# Patient Record
Sex: Female | Born: 1955 | Race: Black or African American | Hispanic: No | Marital: Single | Smoking: Never smoker
Health system: Southern US, Community
[De-identification: ages and names within clinical notes are randomized; demographics above are authoritative.]

## PROBLEM LIST (undated history)

## (undated) DIAGNOSIS — E7800 Pure hypercholesterolemia, unspecified: Secondary | ICD-10-CM

## (undated) DIAGNOSIS — E079 Disorder of thyroid, unspecified: Secondary | ICD-10-CM

---

## 2000-12-02 ENCOUNTER — Emergency Department (HOSPITAL_COMMUNITY): Admission: EM | Admit: 2000-12-02 | Discharge: 2000-12-03 | Payer: Self-pay | Admitting: Emergency Medicine

## 2001-12-17 ENCOUNTER — Other Ambulatory Visit: Admission: RE | Admit: 2001-12-17 | Discharge: 2001-12-17 | Payer: Self-pay | Admitting: *Deleted

## 2003-02-02 ENCOUNTER — Emergency Department (HOSPITAL_COMMUNITY): Admission: AD | Admit: 2003-02-02 | Discharge: 2003-02-02 | Payer: Self-pay | Admitting: Family Medicine

## 2004-07-18 ENCOUNTER — Emergency Department (HOSPITAL_COMMUNITY): Admission: EM | Admit: 2004-07-18 | Discharge: 2004-07-18 | Payer: Self-pay | Admitting: Emergency Medicine

## 2004-08-06 ENCOUNTER — Ambulatory Visit (HOSPITAL_COMMUNITY): Admission: RE | Admit: 2004-08-06 | Discharge: 2004-08-06 | Payer: Self-pay | Admitting: Obstetrics

## 2004-08-14 ENCOUNTER — Encounter (HOSPITAL_COMMUNITY): Admission: RE | Admit: 2004-08-14 | Discharge: 2004-08-15 | Payer: Self-pay | Admitting: Family Medicine

## 2004-09-11 ENCOUNTER — Ambulatory Visit (HOSPITAL_COMMUNITY): Admission: RE | Admit: 2004-09-11 | Discharge: 2004-09-11 | Payer: Self-pay | Admitting: Family Medicine

## 2004-10-18 ENCOUNTER — Ambulatory Visit (HOSPITAL_COMMUNITY): Admission: RE | Admit: 2004-10-18 | Discharge: 2004-10-18 | Payer: Self-pay | Admitting: Endocrinology

## 2006-04-16 IMAGING — NM NM RAI THERAPY FOR HYPERTHYROIDISM
1 series · 1 of 1 positions shown · non-contrast
Comparison: none

HISTORY: Toxic nodular goiter

RADIOACTIVE IODINE THERAPY FOR HYPERTHYROIDISM:
Correlation made to preceding uptake and scan of 08/17/2004.
At the direction of the Dr. Matilda, 30 mCi of X-3N3 sodium iodide was
administered orally as therapy for toxic nodular goiter.

[st statics,dual det. · 2.48mm/px · 1 of 1 slices shown]
[im 1/1]
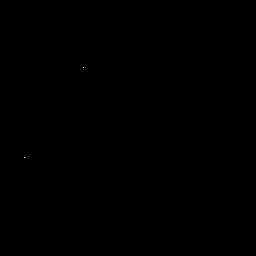

[1 of 1 positions shown; findings below may reference images not displayed]

IMPRESSION: Radioiodine therapy for toxic nodular goiter using 30 mCi of X-3N3.

## 2010-05-19 ENCOUNTER — Encounter: Payer: Self-pay | Admitting: Family Medicine

## 2012-06-29 ENCOUNTER — Encounter (HOSPITAL_COMMUNITY): Payer: Self-pay | Admitting: *Deleted

## 2012-06-29 ENCOUNTER — Emergency Department (HOSPITAL_COMMUNITY)
Admission: EM | Admit: 2012-06-29 | Discharge: 2012-06-29 | Disposition: A | Discharge status: Home / Self Care | Payer: BC Managed Care – PPO | Attending: Emergency Medicine | Admitting: Emergency Medicine

## 2012-06-29 DIAGNOSIS — E7800 Pure hypercholesterolemia, unspecified: Secondary | ICD-10-CM | POA: Insufficient documentation

## 2012-06-29 DIAGNOSIS — R42 Dizziness and giddiness: Secondary | ICD-10-CM | POA: Insufficient documentation

## 2012-06-29 DIAGNOSIS — E079 Disorder of thyroid, unspecified: Secondary | ICD-10-CM | POA: Insufficient documentation

## 2012-06-29 DIAGNOSIS — R5381 Other malaise: Secondary | ICD-10-CM | POA: Insufficient documentation

## 2012-06-29 DIAGNOSIS — Z79899 Other long term (current) drug therapy: Secondary | ICD-10-CM | POA: Insufficient documentation

## 2012-06-29 HISTORY — DX: Pure hypercholesterolemia, unspecified: E78.00

## 2012-06-29 HISTORY — DX: Disorder of thyroid, unspecified: E07.9

## 2012-06-29 LAB — BASIC METABOLIC PANEL
Calcium: 9.4 mg/dL (ref 8.4–10.5)
Chloride: 100 mEq/L (ref 96–112)
Creatinine, Ser: 0.99 mg/dL (ref 0.50–1.10)
GFR calc non Af Amer: 63 mL/min — ABNORMAL LOW (ref >90)
Potassium: 3.8 mEq/L (ref 3.5–5.1)

## 2012-06-29 LAB — CBC WITH DIFFERENTIAL/PLATELET
Eosinophils Relative: 1 % (ref 0–5)
HCT: 40.6 % (ref 36.0–46.0)
Hemoglobin: 13.9 g/dL (ref 12.0–15.0)
Lymphs Abs: 2 10*3/uL (ref 0.7–4.0)
MCV: 85.5 fL (ref 78.0–100.0)
Monocytes Absolute: 0.2 10*3/uL (ref 0.1–1.0)
Neutrophils Relative %: 62 % (ref 43–77)
Platelets: 231 10*3/uL (ref 150–400)
RDW: 12.9 % (ref 11.5–15.5)

## 2012-06-29 MED ORDER — LORAZEPAM 1 MG PO TABS
1.0000 mg | ORAL_TABLET | Freq: Once | ORAL | Status: AC
  Administered 2012-06-29: 1 mg via ORAL
  Filled 2012-06-29: qty 1

## 2012-06-29 MED ORDER — MECLIZINE HCL 25 MG PO TABS
25.0000 mg | ORAL_TABLET | Freq: Once | ORAL | Status: AC
  Administered 2012-06-29: 25 mg via ORAL
  Filled 2012-06-29: qty 1

## 2012-06-29 MED ORDER — MECLIZINE HCL 25 MG PO TABS: 25.0000 mg | ORAL_TABLET | Freq: Three times a day (TID) | ORAL | Status: AC | PRN

## 2012-06-29 MED ORDER — ASPIRIN EC 81 MG PO TBEC
81.0000 mg | DELAYED_RELEASE_TABLET | Freq: Once | ORAL | Status: AC
  Administered 2012-06-29: 81 mg via ORAL
  Filled 2012-06-29: qty 1

## 2012-06-29 NOTE — ED Provider Notes (Signed)
History     CSN: [id number]  Arrival date & time 06/29/12  1433   First MD Initiated Contact with Patient 06/29/12 1528      Chief Complaint  Patient presents with  . Dizziness    (Consider location/radiation/quality/duration/timing/severity/associated sxs/prior treatment) Patient is a 57 y.o. female presenting with weakness. The history is provided by the patient. No language interpreter was used.  Weakness This is a new problem. The current episode started today. Associated symptoms include weakness. Pertinent negatives include no abdominal pain, chest pain, congestion, fever, headaches, nausea, neck pain or vomiting. Associated symptoms comments: She states that since this morning she is experiencing room-spinning dizziness only when she turns onto her right side while recumbent. No headache, N, V, visual changes, weakness, numbness or tingling. Symptoms resolve when she lies still for a period of time. She has very mild similar dizziness when she sits up or stands that is brief in duration. No similar previous symptoms..    Past Medical History  Diagnosis Date  . Thyroid disease   . Hypercholesterolemia     History reviewed. No pertinent past surgical history.  No family history on file.  History  Substance Use Topics  . Smoking status: Never Smoker   . Smokeless tobacco: Not on file  . Alcohol Use: No    OB History   Grav Para Term Preterm Abortions TAB SAB Ect Mult Living                  Review of Systems  Constitutional: Negative for fever and unexpected weight change.  HENT: Negative for congestion, neck pain and sinus pressure.   Respiratory: Negative for shortness of breath.   Cardiovascular: Negative for chest pain.  Gastrointestinal: Negative for nausea, vomiting and abdominal pain.  Genitourinary: Negative for dysuria.  Neurological: Positive for dizziness and weakness. Negative for headaches.  Psychiatric/Behavioral: Negative for confusion.     Allergies  Review of patient's allergies indicates no known allergies.  Home Medications   Current Outpatient Rx  Name  Route  Sig  Dispense  Refill  . levothyroxine (SYNTHROID, LEVOTHROID) 100 MCG tablet   Oral   Take 100 mcg by mouth daily.         . niacin (NIASPAN) 500 MG CR tablet   Oral   Take 500 mg by mouth every evening.         . simvastatin (ZOCOR) 40 MG tablet   Oral   Take 40 mg by mouth every evening.           BP 152/80  Pulse 101  Temp(Src) 98.2 F (36.8 C) (Oral)  Resp 18  SpO2 100%  Physical Exam  Constitutional: She is oriented to person, place, and time. She appears well-developed and well-nourished.  HENT:  Head: Normocephalic.  Eyes: Pupils are equal, round, and reactive to light.  Neck: Normal range of motion. Neck supple.  Cardiovascular: Normal rate and regular rhythm.   Pulmonary/Chest: Effort normal and breath sounds normal.  Abdominal: Soft. Bowel sounds are normal. There is no tenderness. There is no rebound and no guarding.  Musculoskeletal: Normal range of motion.  Neurological: She is alert and oriented to person, place, and time. She has normal strength and normal reflexes. No sensory deficit. She displays a negative Romberg sign. Coordination normal.  Skin: Skin is warm and dry. No rash noted.  Psychiatric: She has a normal mood and affect.    ED Course  Procedures (including critical care time)  Labs Reviewed  BASIC METABOLIC PANEL - Abnormal; Notable for the following:    Glucose, Bld 163 (*)    GFR calc non Af Amer 63 (*)    GFR calc Af Amer 72 (*)    All other components within normal limits  CBC WITH DIFFERENTIAL   No results found.   No diagnosis found.  1. Vertigo   MDM  She is feeling much better with medications, minimal symptoms on re-evaluation. Ambulatory with a steady gait. Discussed returning with any advancing symptoms.        Arnoldo Hooker, PA-C 06/29/12 1658

## 2012-06-29 NOTE — ED Notes (Signed)
Pt states she has been having dizziness since 0600 and states that she turns to the side and dizziness states.  No sob, chest pain, or vision problems.  No headache.

## 2012-06-30 NOTE — ED Provider Notes (Signed)
Medical screening examination/treatment/procedure(s) were performed by non-physician practitioner and as supervising physician I was immediately available for consultation/collaboration.   Lyanne Co, MD 06/30/12 [phone]

## 2013-09-21 ENCOUNTER — Ambulatory Visit: Payer: Self-pay

## 2013-09-26 ENCOUNTER — Ambulatory Visit (INDEPENDENT_AMBULATORY_CARE_PROVIDER_SITE_OTHER): Payer: BC Managed Care – PPO

## 2013-09-26 VITALS — BP 121/80 | HR 61 | Resp 15 | Ht 59.0 in | Wt 140.0 lb

## 2013-09-26 DIAGNOSIS — M2040 Other hammer toe(s) (acquired), unspecified foot: Secondary | ICD-10-CM

## 2013-09-26 DIAGNOSIS — M2010 Hallux valgus (acquired), unspecified foot: Secondary | ICD-10-CM

## 2013-09-26 DIAGNOSIS — M79609 Pain in unspecified limb: Secondary | ICD-10-CM

## 2013-09-26 DIAGNOSIS — M21619 Bunion of unspecified foot: Secondary | ICD-10-CM

## 2013-09-26 DIAGNOSIS — Q66219 Congenital metatarsus primus varus, unspecified foot: Secondary | ICD-10-CM

## 2013-09-26 DIAGNOSIS — M779 Enthesopathy, unspecified: Secondary | ICD-10-CM

## 2013-09-26 NOTE — Patient Instructions (Signed)
Pre-Operative Instructions  Congratulations, you have decided to take an important step to improving your quality of life.  You can be assured that the doctors of [facility] will be with you every step of the way.  1. Plan to be at the surgery center/hospital at least 1 (one) hour prior to your scheduled time unless otherwise directed by the surgical center/hospital staff.  You must have a responsible adult accompany you, remain during the surgery and drive you home.  Make sure you have directions to the surgical center/hospital and know how to get there on time. 2. For hospital based surgery you will need to obtain a history and physical form from your family physician within 1 month prior to the date of surgery- we will give you a form for you primary physician.  3. We make every effort to accommodate the date you request for surgery.  There are however, times where surgery dates or times have to be moved.  We will contact you as soon as possible if a change in schedule is required.   4. No Aspirin/Ibuprofen for one week before surgery.  If you are on aspirin, any non-steroidal anti-inflammatory medications (Mobic, Aleve, Ibuprofen) you should stop taking it 7 days prior to your surgery.  You make take Tylenol  For pain prior to surgery.  5. Medications- If you are taking daily heart and blood pressure medications, seizure, reflux, allergy, asthma, anxiety, pain or diabetes medications, make sure the surgery center/hospital is aware before the day of surgery so they may notify you which medications to take or avoid the day of surgery. 6. No food or drink after midnight the night before surgery unless directed otherwise by surgical center/hospital staff. 7. No alcoholic beverages 24 hours prior to surgery.  No smoking 24 hours prior to or 24 hours after surgery. 8. Wear loose pants or shorts- loose enough to fit over bandages, boots, and casts. 9. No slip on shoes, sneakers are best. 10. Bring  your boot with you to the surgery center/hospital.  Also bring crutches or a walker if your physician has prescribed it for you.  If you do not have this equipment, it will be provided for you after surgery. 11. If you have not been contracted by the surgery center/hospital by the day before your surgery, call to confirm the date and time of your surgery. 12. Leave-time from work may vary depending on the type of surgery you have.  Appropriate arrangements should be made prior to surgery with your employer. 13. Prescriptions will be provided immediately following surgery by your doctor.  Have these filled as soon as possible after surgery and take the medication as directed. 14. Remove nail polish on the operative foot. 15. Wash the night before surgery.  The night before surgery wash the foot and leg well with the antibacterial soap provided and water paying special attention to beneath the toenails and in between the toes.  Rinse thoroughly with water and dry well with a towel.  Perform this wash unless told not to do so by your physician.  Enclosed: 1 Ice pack (please put in freezer the night before surgery)   1 Hibiclens skin cleaner   Pre-op Instructions  If you have any questions regarding the instructions, do not hesitate to call our office.  [place]: [address] [place], [place] [address] [phone]  [place]: [address] [place], [place] [address] [phone]  [place]: [address]  , [place] [address] [phone]  Dr. Hiroki Wint   Tuchman DPM, Dr. Norman Regal DPM Dr. Yvonnie Schinke DPM, Dr. M. Todd Hyatt DPM, Dr. Kathryn Egerton DPM 

## 2013-09-26 NOTE — Progress Notes (Signed)
   Subjective:    Patient ID: Vicki Walls, female    DOB: June 24, 1955, 58 y.o.   MRN: [id number]  HPI Comments: N bunions L B/L feet, left > right D 4 years O worsening, especially wearing steel-toed shoes C 1st MPJs are enlarged, and 1st toes are abducted, consecutive toes are contracted, and feet are painful. A enclosed shoes, especially steel-toed shoes, and hard flooring T none     Review of Systems  All other systems reviewed and are negative.      Objective:   Physical Exam 58 year old after making to help since this time well-developed well-nourished oriented x3 with complaint of painful bunions of both feet typically with enclosed shoes but she her steel toe work boots difficulty with ambulation left foot more severe than right ulcers contractures of toes with corns second toe left foot due to irritation from shoe wear and deformity of the digit which is semirigid in nature as well as a track of hallux on the left foot. 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 Left foot is painful symptomatic with activities in relation position surgery was to plantar surgery for September of this year with the next several months. Understands that surgery will record her nonweightbearing for at least is 6-8 week duration 10-12 weeks complete in air fracture boot and probably 12 weeks before she can return to work activities after surgery. She will likely nonweightbearing with crutches or a rollabout knee walker. Patient understands that at this time consent form for Lapidus bunionectomy with possible reviewed modification for correction of proximal articular set and tracked hallux also hammertoe repair with K wire fixation second toe both of left foot. The surgical correction to be done an outpatient basis under IV sedation and regional anesthetic or local block. Patient will be in air fracture boot for 10-12 week duration as instructed 6 weeks minimum nonweightbearing all questions asked medication are answered there no complications and surgery scheduled her convenience with probing followup she plans aligned to the contralateral right foot which is severe possibly next year some time. Next  Harriet Masson DPM

## 2014-01-12 ENCOUNTER — Telehealth: Payer: Self-pay

## 2014-01-12 NOTE — Telephone Encounter (Signed)
Patient would like to schedule a foot surgery on October 29 th. Thanks

## 2014-01-17 ENCOUNTER — Telehealth: Payer: Self-pay | Admitting: *Deleted

## 2014-01-17 NOTE — Telephone Encounter (Signed)
Patient came by the office to schedule surgery.  I couldn't find her consent form.  She was going to go home and see if she mistakenly took it home with her.  I called and informed the patient that I found her papers for the surgery.  We will go ahead and schedule it for 02/22/2014.  The surgical center will call you 1-2 days prior to surgery to let you know about your arrival time.  She stated okay thank you.

## 2014-02-22 DIAGNOSIS — M2022 Hallux rigidus, left foot: Secondary | ICD-10-CM

## 2014-02-22 DIAGNOSIS — M2012 Hallux valgus (acquired), left foot: Secondary | ICD-10-CM

## 2014-02-28 ENCOUNTER — Ambulatory Visit (INDEPENDENT_AMBULATORY_CARE_PROVIDER_SITE_OTHER): Payer: BC Managed Care – PPO

## 2014-02-28 VITALS — BP 123/69 | HR 71 | Resp 13

## 2014-02-28 DIAGNOSIS — M21612 Bunion of left foot: Secondary | ICD-10-CM

## 2014-02-28 DIAGNOSIS — M2012 Hallux valgus (acquired), left foot: Secondary | ICD-10-CM

## 2014-02-28 DIAGNOSIS — Z09 Encounter for follow-up examination after completed treatment for conditions other than malignant neoplasm: Secondary | ICD-10-CM

## 2014-02-28 DIAGNOSIS — Q662 Congenital metatarsus (primus) varus: Secondary | ICD-10-CM

## 2014-02-28 DIAGNOSIS — M2042 Other hammer toe(s) (acquired), left foot: Secondary | ICD-10-CM

## 2014-02-28 DIAGNOSIS — Q66219 Congenital metatarsus primus varus, unspecified foot: Secondary | ICD-10-CM

## 2014-02-28 NOTE — Progress Notes (Signed)
   Subjective:    Patient ID: Vicki Walls, female    DOB: 04-14-1956, 58 y.o.   MRN: [id number]  HPI Comments: DOS 02/22/2014 left lapidus procedure with austin bunionectomy and left 2nd hammer toe.  Pt states she is doing fine with only occasional pain, and is off-loading the left foot by using crutches.     Review of Systemsno new findings or systemic changes noted     Objective:   Physical Exam Neurovascular status is intact pedal pulses palpable mild edema and ecchymosis consistent with postop course patient is 6 days status post Lapidus bunionectomy left foot as well as hammertoe repair second digit left foot with intact fixation x-rays reveal adequate correction of digits intact fixation of the toe with K wire ends screw fixation first metatarsal. Good clinical and radiographic alignment noted was a flexion proximally 30-35 plantar flexion 10:15 degrees. No dehiscence no discharge drainage or assessment good postop progress       Assessment & Plan:  Assessment good postop right is fine Lapidus bunionectomy and hammertoe second left presto compressive dressing reapplied at this time maintain air fracture boot nonweightbearing as instructed reappointed one week plan for suture removal patient will be in a boot for at least 6-7 more weeks at least 5 more weeks nonweightbearing. Reappointed one week for suture removal next  Harriet Masson DPM

## 2014-02-28 NOTE — Patient Instructions (Signed)

## 2014-03-07 ENCOUNTER — Ambulatory Visit (INDEPENDENT_AMBULATORY_CARE_PROVIDER_SITE_OTHER): Payer: BC Managed Care – PPO

## 2014-03-07 DIAGNOSIS — Z09 Encounter for follow-up examination after completed treatment for conditions other than malignant neoplasm: Secondary | ICD-10-CM

## 2014-03-07 DIAGNOSIS — Q662 Congenital metatarsus (primus) varus: Secondary | ICD-10-CM

## 2014-03-07 DIAGNOSIS — Q66219 Congenital metatarsus primus varus, unspecified foot: Secondary | ICD-10-CM

## 2014-03-07 DIAGNOSIS — M21612 Bunion of left foot: Secondary | ICD-10-CM

## 2014-03-07 DIAGNOSIS — M2012 Hallux valgus (acquired), left foot: Secondary | ICD-10-CM

## 2014-03-07 NOTE — Progress Notes (Signed)
   Subjective:    Patient ID: Vicki Walls, female    DOB: July 12, 1955, 58 y.o.   MRN: [id number]  HPI  Pt presents for suture removal  Review of Systemsno new findings or systemic changes noted     Objective:   Physical Exam Neurovascular status is intact pedal pulses are palpable patient is noted 2 weeks status post last Lapidus bunionectomy left and hammertoe repair second left sutures removed and the second toe this time Coflex wrap applied to the second toe of the buddy fashion to the third toe. An anklet is dispensed to maintain compression for the great toe joint and forefoot. Maintain boot and anklet at all times during the day patient will maintain nonweightbearing with crutches for 4 more weeks as instructed reappointed 4 weeks for long-term follow-up x-rays taken at last visit reveal good alignment clinically good alignment no dehiscence no discharge drainage no signs of infection       Assessment & Plan:  Assessment good postop progress compressive dressing to be maintained maintain air fracture boot and nonweightbearing for 4 more weeks Coflex wrapping of toes is recommended may resume normal bathing and hygiene as instructed recontact [place] any difficulties or changes in the interim. Next  Harriet Masson DPM

## 2014-03-07 NOTE — Patient Instructions (Addendum)
ICE INSTRUCTIONS  Apply ice or cold pack to the affected area at least 3 times a day for 10-15 minutes each time.  You should also use ice after prolonged activity or vigorous exercise.  Do not apply ice longer than 20 minutes at one time.  Always keep a cloth between your skin and the ice pack to prevent burns.  Being consistent and following these instructions will help control your symptoms.  We suggest you purchase a gel ice pack because they are reusable and do bit leak.  Some of them are designed to wrap around the area.  Use the method that works best for you.  Here are some other suggestions for icing.   Use a frozen bag of peas or corn-inexpensive and molds well to your body, usually stays frozen for 10 to 20 minutes.  Wet a towel with cold water and squeeze out the excess until it's damp.  Place in a bag in the freezer for 20 minutes. Then remove and use.  May resume normal bath or shower quick wash of the foot 5 or 10 minutes then apply Neosporin or cocoa butter to the incisions. Rewrap the toe with Coflex wrapping as instructed also maintain Ace compression stockinette during the day all times the stocking can be washed in the evening allowed to air dry and then reapplied to the foot every day. Next  Maintain boot and nonweightbearing for 4 more weeks as instructed must use crutches at all times and leave the boot on at all times including during sleep

## 2014-04-04 ENCOUNTER — Ambulatory Visit (INDEPENDENT_AMBULATORY_CARE_PROVIDER_SITE_OTHER): Payer: BC Managed Care – PPO

## 2014-04-04 VITALS — BP 134/69 | HR 86 | Resp 12

## 2014-04-04 DIAGNOSIS — M2012 Hallux valgus (acquired), left foot: Secondary | ICD-10-CM

## 2014-04-04 DIAGNOSIS — Z09 Encounter for follow-up examination after completed treatment for conditions other than malignant neoplasm: Secondary | ICD-10-CM

## 2014-04-04 DIAGNOSIS — M2042 Other hammer toe(s) (acquired), left foot: Secondary | ICD-10-CM

## 2014-04-04 NOTE — Patient Instructions (Addendum)
ICE INSTRUCTIONS  Apply ice or cold pack to the affected area at least 3 times a day for 10-15 minutes each time.  You should also use ice after prolonged activity or vigorous exercise.  Do not apply ice longer than 20 minutes at one time.  Always keep a cloth between your skin and the ice pack to prevent burns.  Being consistent and following these instructions will help control your symptoms.  We suggest you purchase a gel ice pack because they are reusable and do bit leak.  Some of them are designed to wrap around the area.  Use the method that works best for you.  Here are some other suggestions for icing.   Use a frozen bag of peas or corn-inexpensive and molds well to your body, usually stays frozen for 10 to 20 minutes.  Wet a towel with cold water and squeeze out the excess until it's damp.  Place in a bag in the freezer for 20 minutes. Then remove and use.   May discontinue the use of crutches at this time however must maintain the boot for 4 more weeks from today's date reappointed 4 weeks for follow-up x-ray at next visit bring walking or athletic shoes. Anticipate that by January 9 should be able to return to work activities  Do toe exercises moving great toe up and down 100 times a day to help maintain flexibility of the great toe joint

## 2014-04-04 NOTE — Progress Notes (Signed)
   Subjective:    Patient ID: Vicki Walls, female    DOB: 1955-11-10, 58 y.o.   MRN: [id number]  HPI  DOS 02/22/2014 left lapidus procedure with austin bunionectomy and left 2nd hammer toe.''LT FOOT AND 2ND TOE DOING OK AND HEELING PRETTY WELL.'' Review of Systems No new findings or systemic changes noted    Objective:   Physical Exam Neurovascular status is intact pedal pulses are palpable epicritic and proprioceptive sensations intact left foot is doing well status post Lapidus bunionectomy and hammertoe second left. Patient still has sutures intact and removed from the second toe left foot at this time also x-rays reveal good consolidation of the osteotomies and bone and the pin for the second toe was also removed at this time Neosporin Coflex wrap are applied to the second toe. No dehiscence no discharge drainage no signs of infection x-rays reveal good alignment of the first metatarsal base intact screw fixation good correction from bunion good range of motion approximately 4050 dorsal flexion 510 plantar flexion at the first MTP is noted advised to continue with exercising May discontinue crutches at this time and maintain walking the air fracture boot for 4 more weeks.       Assessment & Plan:  Assessment good postop progress following Lapidus bunionectomy left foot maintain boot for 4 more weeks reappointed 4 weeks for follow-up x-ray after that may return to come for walking tennis shoes anticipate returning to work in January 9 of 2016. Contact us any changes or exacerbations occur in the interim are maintain boot as instructed for the next 4 weeks also stressed the importance of continued range of motion exercises moving the big toe up and down 100 toe pushups a day as recommended next  Harriet Masson DPM

## 2014-05-02 ENCOUNTER — Ambulatory Visit (INDEPENDENT_AMBULATORY_CARE_PROVIDER_SITE_OTHER): Payer: BLUE CROSS/BLUE SHIELD

## 2014-05-02 VITALS — BP 122/75 | HR 71 | Resp 18

## 2014-05-02 DIAGNOSIS — M2012 Hallux valgus (acquired), left foot: Secondary | ICD-10-CM

## 2014-05-02 DIAGNOSIS — M2042 Other hammer toe(s) (acquired), left foot: Secondary | ICD-10-CM

## 2014-05-02 DIAGNOSIS — Z09 Encounter for follow-up examination after completed treatment for conditions other than malignant neoplasm: Secondary | ICD-10-CM

## 2014-05-02 NOTE — Patient Instructions (Signed)
ICE INSTRUCTIONS  Apply ice or cold pack to the affected area at least 3 times a day for 10-15 minutes each time.  You should also use ice after prolonged activity or vigorous exercise.  Do not apply ice longer than 20 minutes at one time.  Always keep a cloth between your skin and the ice pack to prevent burns.  Being consistent and following these instructions will help control your symptoms.  We suggest you purchase a gel ice pack because they are reusable and do bit leak.  Some of them are designed to wrap around the area.  Use the method that works best for you.  Here are some other suggestions for icing.   Use a frozen bag of peas or corn-inexpensive and molds well to your body, usually stays frozen for 10 to 20 minutes.  Wet a towel with cold water and squeeze out the excess until it's damp.  Place in a bag in the freezer for 20 minutes. Then remove and use.   Maintain a lace up athletic or walking shoe at all times no barefoot no flimsy shoes or flip-flops

## 2014-05-02 NOTE — Progress Notes (Signed)
   Subjective:    Patient ID: Vicki Walls, female    DOB: August 10, 1955, 59 y.o.   MRN: [id number]  HPI I AM DOING GOOD ON MY LEFT FOOT    Review of Systems no new findings or systemic changes noted    Objective:   Physical Exam Patient presents this time proxy 9-10 weeks status post Lapidus bunionectomy left foot with hammertoe repair second left incisions well coapted no pain discomfort mild edema noted still some contractures lesser digits noted the hallux appears to be a good rectus position intact screw fixation noted first metatarsal base good consolidation of the osteotomy noted good range of motion actively proxy 56 dorsiflexion 5-10 plantar flexion at the MTP joint. Assessment good postop progress       Assessment & Plan:  The postop progress fungal Lapidus bunionectomy left and hammertoe second left may discontinue boot and return to come for walking tennis or athletic shoe. Reappointed in one month for long-term follow-up and postop x-ray maintain appropriate accommodative shoes at all times no barefoot no flimsy shoes no flip-flops elevated ice when possible  Harriet Masson DPM

## 2014-06-06 ENCOUNTER — Ambulatory Visit (INDEPENDENT_AMBULATORY_CARE_PROVIDER_SITE_OTHER): Payer: BLUE CROSS/BLUE SHIELD

## 2014-06-06 VITALS — BP 114/60 | HR 80 | Resp 12

## 2014-06-06 DIAGNOSIS — Z09 Encounter for follow-up examination after completed treatment for conditions other than malignant neoplasm: Secondary | ICD-10-CM

## 2014-06-06 DIAGNOSIS — M2042 Other hammer toe(s) (acquired), left foot: Secondary | ICD-10-CM

## 2014-06-06 DIAGNOSIS — M21612 Bunion of left foot: Secondary | ICD-10-CM

## 2014-06-06 DIAGNOSIS — M2012 Hallux valgus (acquired), left foot: Secondary | ICD-10-CM

## 2014-06-06 NOTE — Progress Notes (Signed)
   Subjective:    Patient ID: Francee Nodal, female    DOB: 26-Sep-1955, 59 y.o.   MRN: [id number]  HPI  DOS 02/22/2014 left lapidus procedure with bunionectomy and left 2nd hammer toe.Marland Kitchen ''LT FOOT  STILL THROBS AT NIGHT.''  Review of Systems no new findings or systemic changes     Objective:   Physical Exam Neurovascular status is intact pedal pulses are palpable she is just over 3 months postop Lapidus bunionectomy left foot as well as hammertoe second left. The osteotomies are well healed and coapted x-rays and screws intact nondisplaced good [place] good consolidation of the first met cuneiform osteotomy noted no pain or discomfort on range of motion palpation has at least 60 dorsiflexion first MTP area 510 plantar flexion second toes and good rectus position and radiographically. Again pedal pulses palpable no excessive pain occasionally some throbbing or aching this is consistent with a long-term postop and mild arthropathy.       Assessment & Plan:  Assessment good postop progress good consolidation of osteotomies hallux appears rectus slight abduction noted although consistent with a normal architecture patient discharged to an as-needed basis for any future follow-up work without restrictions starting tomorrow next  [employer] DPM

## 2021-05-29 DIAGNOSIS — E785 Hyperlipidemia, unspecified: Secondary | ICD-10-CM | POA: Diagnosis not present

## 2021-05-29 DIAGNOSIS — E039 Hypothyroidism, unspecified: Secondary | ICD-10-CM | POA: Diagnosis not present

## 2021-05-29 DIAGNOSIS — E559 Vitamin D deficiency, unspecified: Secondary | ICD-10-CM | POA: Diagnosis not present

## 2021-05-29 DIAGNOSIS — R7303 Prediabetes: Secondary | ICD-10-CM | POA: Diagnosis not present

## 2021-05-29 DIAGNOSIS — N289 Disorder of kidney and ureter, unspecified: Secondary | ICD-10-CM | POA: Diagnosis not present

## 2021-06-06 DIAGNOSIS — R748 Abnormal levels of other serum enzymes: Secondary | ICD-10-CM | POA: Diagnosis not present

## 2021-06-06 DIAGNOSIS — E782 Mixed hyperlipidemia: Secondary | ICD-10-CM | POA: Diagnosis not present

## 2021-06-06 DIAGNOSIS — Z1211 Encounter for screening for malignant neoplasm of colon: Secondary | ICD-10-CM | POA: Diagnosis not present

## 2021-06-06 DIAGNOSIS — E039 Hypothyroidism, unspecified: Secondary | ICD-10-CM | POA: Diagnosis not present

## 2021-07-22 DIAGNOSIS — E559 Vitamin D deficiency, unspecified: Secondary | ICD-10-CM | POA: Diagnosis not present

## 2021-07-22 DIAGNOSIS — D170 Benign lipomatous neoplasm of skin and subcutaneous tissue of head, face and neck: Secondary | ICD-10-CM | POA: Diagnosis not present

## 2021-07-22 DIAGNOSIS — E039 Hypothyroidism, unspecified: Secondary | ICD-10-CM | POA: Diagnosis not present

## 2021-07-22 DIAGNOSIS — E785 Hyperlipidemia, unspecified: Secondary | ICD-10-CM | POA: Diagnosis not present

## 2021-07-22 DIAGNOSIS — R7303 Prediabetes: Secondary | ICD-10-CM | POA: Diagnosis not present

## 2021-07-22 DIAGNOSIS — N289 Disorder of kidney and ureter, unspecified: Secondary | ICD-10-CM | POA: Diagnosis not present

## 2021-07-31 DIAGNOSIS — D122 Benign neoplasm of ascending colon: Secondary | ICD-10-CM | POA: Diagnosis not present

## 2021-07-31 DIAGNOSIS — K635 Polyp of colon: Secondary | ICD-10-CM | POA: Diagnosis not present

## 2021-07-31 DIAGNOSIS — Z1211 Encounter for screening for malignant neoplasm of colon: Secondary | ICD-10-CM | POA: Diagnosis not present

## 2021-11-21 DIAGNOSIS — E559 Vitamin D deficiency, unspecified: Secondary | ICD-10-CM | POA: Diagnosis not present

## 2021-11-21 DIAGNOSIS — N289 Disorder of kidney and ureter, unspecified: Secondary | ICD-10-CM | POA: Diagnosis not present

## 2021-11-21 DIAGNOSIS — E785 Hyperlipidemia, unspecified: Secondary | ICD-10-CM | POA: Diagnosis not present

## 2021-11-21 DIAGNOSIS — E039 Hypothyroidism, unspecified: Secondary | ICD-10-CM | POA: Diagnosis not present

## 2021-11-21 DIAGNOSIS — Z0001 Encounter for general adult medical examination with abnormal findings: Secondary | ICD-10-CM | POA: Diagnosis not present

## 2021-11-21 DIAGNOSIS — R7303 Prediabetes: Secondary | ICD-10-CM | POA: Diagnosis not present

## 2021-12-18 DIAGNOSIS — D170 Benign lipomatous neoplasm of skin and subcutaneous tissue of head, face and neck: Secondary | ICD-10-CM | POA: Diagnosis not present

## 2021-12-23 DIAGNOSIS — D170 Benign lipomatous neoplasm of skin and subcutaneous tissue of head, face and neck: Secondary | ICD-10-CM | POA: Diagnosis not present

## 2022-01-31 ENCOUNTER — Telehealth: Payer: Self-pay | Admitting: Internal Medicine

## 2022-01-31 ENCOUNTER — Encounter: Payer: Self-pay | Admitting: Internal Medicine

## 2022-01-31 ENCOUNTER — Ambulatory Visit: Payer: Medicare Other | Admitting: Internal Medicine

## 2022-01-31 VITALS — BP 130/80 | HR 66 | Ht 59.0 in | Wt 136.0 lb

## 2022-01-31 DIAGNOSIS — E890 Postprocedural hypothyroidism: Secondary | ICD-10-CM | POA: Diagnosis not present

## 2022-01-31 LAB — TSH: TSH: 0.05 u[IU]/mL — ABNORMAL LOW (ref 0.35–5.50)

## 2022-01-31 MED ORDER — LEVOTHYROXINE SODIUM 88 MCG PO TABS: 88.0000 ug | ORAL_TABLET | Freq: Every day | ORAL | 3 refills | Status: DC

## 2022-01-31 NOTE — Telephone Encounter (Signed)
Let the patient know that the current dose of levothyroxine is too much for her and we will need to reduce the dose as below    Please confirm that she has been taking levothyroxine 100 mcg (see if she can check a bottle at home)   Stop levothyroxine 100 Start levothyroxine 88 mcg daily   Please schedule patient for a repeat lab appointment in 2 months   Thanks

## 2022-01-31 NOTE — Telephone Encounter (Signed)
Attempted to contact the patient regarding lab results and received no answer. LVM for a call back.

## 2022-01-31 NOTE — Progress Notes (Signed)
Name: Vicki Walls  MRN/ DOB: [id number], 10-08-1955    Age/ Sex: 66 y.o., female    PCP: Trey Sailors, PA   Reason for Endocrinology Evaluation: Hypothyroidism     Date of Initial Endocrinology Evaluation: 01/31/2022     HPI: Vicki Walls is a 66 y.o. female with a past medical history of postablative hypothyroid. The patient presented for initial endocrinology clinic visit on 01/31/2022 for consultative assistance with her Hypothyroidism.   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  She is s/p RAI on 10/18/2004   30 mCi of I-131.    She had a TSH of 82 u IU/mL in January 2023, but the patient admits that she had ran out of levothyroxine at the time    Weight stable  Denies local neck swelling  Denies loose stools or diarrhea  Denies palpitations    No biotin  Niece  with thyroid disease     HISTORY:  Past Medical History:  Past Medical History:  Diagnosis Date   Hypercholesterolemia    Thyroid disease    Past Surgical History: No past surgical history on file.  Social History:  reports that she has never smoked. She does not have any smokeless tobacco history on file. She reports that she does not drink alcohol and does not use drugs. Family History: family history includes Hypertension in her brother and mother.   HOME MEDICATIONS: Allergies as of 01/31/2022   No Known Allergies      Medication List        Accurate as of January 31, 2022 10:51 AM. If you have any questions, ask your nurse or doctor.          amoxicillin 500 MG capsule Commonly known as: AMOXIL   cephALEXin 500 MG capsule Commonly known as: KEFLEX   fenofibrate 145 MG tablet Commonly known as: TRICOR   ibuprofen 800 MG tablet Commonly known as: ADVIL   levothyroxine 100 MCG tablet Commonly known as:  SYNTHROID Take 100 mcg by mouth daily.   meclizine 25 MG tablet Commonly known as: ANTIVERT Take 1 tablet (25 mg total) by mouth 3 (three) times daily as needed.   niacin 500 MG ER tablet Commonly known as: VITAMIN B3 Take 500 mg by mouth every evening.   oxyCODONE-acetaminophen 5-325 MG tablet Commonly known as: PERCOCET/ROXICET   simvastatin 40 MG tablet Commonly known as: ZOCOR Take 40 mg by mouth every evening.          REVIEW OF SYSTEMS: A comprehensive ROS was conducted with the patient and is negative except as per HPI     OBJECTIVE:  VS: BP 130/80 (BP Location: Left Arm, Patient Position: Sitting, Cuff Size: Small)   Pulse 66   Ht '4\' 11"'$  (1.499 m)   Wt 136 lb (61.7 kg)   SpO2 99%   BMI 27.47 kg/m    Wt Readings from Last 3 Encounters:  01/31/22 136 lb (61.7 kg)  09/26/13 140 lb (63.5 kg)     EXAM: General: Pt appears well and is in NAD  Eyes: External eye exam normal without stare, lid lag or exophthalmos.  EOM intact.  PERRL.  Neck: General: Supple without adenopathy. Thyroid: Thyroid size normal.  No goiter or nodules appreciated. No thyroid bruit.  Lungs: Clear with good BS bilat with no rales, rhonchi, or wheezes  Heart:  Auscultation: RRR.  Abdomen: Normoactive bowel sounds, soft, nontender, without masses or organomegaly palpable  Extremities:  BL LE: No pretibial edema normal ROM and strength.  Mental Status: Judgment, insight: Intact Orientation: Oriented to time, place, and person Mood and affect: No depression, anxiety, or agitation     DATA REVIEWED:     Latest Reference Range & Units 01/31/22 11:02  TSH 0.35 - 5.50 uIU/mL 0.05 (L)    ASSESSMENT/PLAN/RECOMMENDATIONS:   Postablative hypothyroidism:  -Pt is clinically euthyroid  - Pt educated extensively on the correct way to take levothyroxine (first thing in the morning with water, 30 minutes before eating or taking other medications). - Pt encouraged to double dose the  following day if she were to miss a dose given long half-life of levothyroxine. -We discussed the importance of taking LT-for replacement as prescribed, we discussed the risk of myxedema coma with inappropriate replacement   Medications : Stop levothyroxine 100 mcg daily Start levothyroxine 88 mcg daily   Labs in 2 months  Signed electronically by: Mack Guise, MD  [facility]  [facility] [address] [place], [place] [address] Phone: [phone] FAX: [phone]   CC: Trey Sailors, [place] [address] Phone: [phone] Fax: [phone]   Return to Endocrinology clinic as below: No future appointments.

## 2022-02-03 NOTE — Telephone Encounter (Signed)
2 attempt to contact patient regarding labs. LVM for a call back.

## 2022-02-03 NOTE — Telephone Encounter (Signed)
LMTCB

## 2022-02-04 NOTE — Telephone Encounter (Signed)
LMTCB

## 2022-02-04 NOTE — Telephone Encounter (Signed)
Letter printed and sent for outgoing mail as we have been unable to successfully contact the patient after 3 attempts.

## 2022-03-06 DIAGNOSIS — Z1231 Encounter for screening mammogram for malignant neoplasm of breast: Secondary | ICD-10-CM | POA: Diagnosis not present

## 2022-05-22 DIAGNOSIS — E785 Hyperlipidemia, unspecified: Secondary | ICD-10-CM | POA: Diagnosis not present

## 2022-05-22 DIAGNOSIS — E559 Vitamin D deficiency, unspecified: Secondary | ICD-10-CM | POA: Diagnosis not present

## 2022-05-22 DIAGNOSIS — E039 Hypothyroidism, unspecified: Secondary | ICD-10-CM | POA: Diagnosis not present

## 2022-05-22 DIAGNOSIS — R7303 Prediabetes: Secondary | ICD-10-CM | POA: Diagnosis not present

## 2022-05-22 DIAGNOSIS — N289 Disorder of kidney and ureter, unspecified: Secondary | ICD-10-CM | POA: Diagnosis not present

## 2022-05-26 DIAGNOSIS — E559 Vitamin D deficiency, unspecified: Secondary | ICD-10-CM | POA: Diagnosis not present

## 2022-05-26 DIAGNOSIS — E785 Hyperlipidemia, unspecified: Secondary | ICD-10-CM | POA: Diagnosis not present

## 2022-05-26 DIAGNOSIS — N289 Disorder of kidney and ureter, unspecified: Secondary | ICD-10-CM | POA: Diagnosis not present

## 2022-05-26 DIAGNOSIS — E119 Type 2 diabetes mellitus without complications: Secondary | ICD-10-CM | POA: Diagnosis not present

## 2022-05-26 DIAGNOSIS — E039 Hypothyroidism, unspecified: Secondary | ICD-10-CM | POA: Diagnosis not present

## 2022-06-17 ENCOUNTER — Ambulatory Visit (INDEPENDENT_AMBULATORY_CARE_PROVIDER_SITE_OTHER): Payer: Medicare Other | Admitting: Podiatry

## 2022-06-17 ENCOUNTER — Other Ambulatory Visit: Payer: Self-pay | Admitting: Internal Medicine

## 2022-06-17 DIAGNOSIS — E559 Vitamin D deficiency, unspecified: Secondary | ICD-10-CM

## 2022-06-17 DIAGNOSIS — E039 Hypothyroidism, unspecified: Secondary | ICD-10-CM

## 2022-06-17 DIAGNOSIS — Z91199 Patient's noncompliance with other medical treatment and regimen due to unspecified reason: Secondary | ICD-10-CM

## 2022-06-17 NOTE — Progress Notes (Signed)
No show

## 2022-07-11 ENCOUNTER — Ambulatory Visit: Payer: Medicare Other | Admitting: Dietician

## 2022-07-28 DIAGNOSIS — E039 Hypothyroidism, unspecified: Secondary | ICD-10-CM | POA: Diagnosis not present

## 2022-07-28 DIAGNOSIS — E559 Vitamin D deficiency, unspecified: Secondary | ICD-10-CM | POA: Diagnosis not present

## 2022-07-28 DIAGNOSIS — E119 Type 2 diabetes mellitus without complications: Secondary | ICD-10-CM | POA: Diagnosis not present

## 2022-07-28 DIAGNOSIS — Z0000 Encounter for general adult medical examination without abnormal findings: Secondary | ICD-10-CM | POA: Diagnosis not present

## 2022-07-28 DIAGNOSIS — E785 Hyperlipidemia, unspecified: Secondary | ICD-10-CM | POA: Diagnosis not present

## 2022-08-05 ENCOUNTER — Ambulatory Visit (INDEPENDENT_AMBULATORY_CARE_PROVIDER_SITE_OTHER): Payer: Medicare Other | Admitting: Internal Medicine

## 2022-08-05 ENCOUNTER — Telehealth: Payer: Self-pay | Admitting: Internal Medicine

## 2022-08-05 ENCOUNTER — Encounter: Payer: Self-pay | Admitting: Internal Medicine

## 2022-08-05 VITALS — BP 124/72 | HR 74 | Ht 59.0 in | Wt 132.8 lb

## 2022-08-05 DIAGNOSIS — E890 Postprocedural hypothyroidism: Secondary | ICD-10-CM | POA: Diagnosis not present

## 2022-08-05 LAB — TSH: TSH: 0.03 u[IU]/mL — ABNORMAL LOW (ref 0.35–5.50)

## 2022-08-05 MED ORDER — LEVOTHYROXINE SODIUM 75 MCG PO TABS: 75.0000 ug | ORAL_TABLET | Freq: Every day | ORAL | 3 refills | Status: AC

## 2022-08-05 NOTE — Patient Instructions (Signed)

## 2022-08-05 NOTE — Telephone Encounter (Signed)
Patient advised and labs have been scheduled for 11/04/22

## 2022-08-05 NOTE — Progress Notes (Signed)
Name: Vicki Walls  MRN/ DOB: [id number], [birth year]    Age/ Sex: 67 y.o., female    PCP: Norm Salt, PA   Reason for Endocrinology Evaluation: Hypothyroidism     Date of Initial Endocrinology Evaluation: 01/31/2022    HPI: Ms. Vicki Walls is a 67 y.o. female with a past medical history of postablative hypothyroid. The patient presented for initial endocrinology clinic visit on 01/31/2022  for consultative assistance with her Hypothyroidism.   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  She is s/p RAI on 10/18/2004   30 mCi of I-131.    She had a TSH of 82 u IU/mL in January 2023, but the patient admits that she had ran out of levothyroxine at the time     No biotin  Niece  with thyroid disease   SUBJECTIVE:    Today (08/05/22): Vicki Walls is here for a follow up on hypothyroidism  Weight stable  Denies local neck swelling  Denies loose stools or diarrhea  Denies palpitations  Denies tremors    Levothyroxine 88 mcg daily   HISTORY:  Past Medical History:  Past Medical History:  Diagnosis Date   Hypercholesterolemia    Thyroid disease    Past Surgical History: No past surgical history on file.  Social History:  reports that she has never smoked. She does not have any smokeless tobacco history on file. She reports that she does not drink alcohol and does not use drugs. Family History: family history includes Hypertension in her brother and mother.   HOME MEDICATIONS: Allergies as of 08/05/2022   No Known Allergies      Medication List        Accurate as of August 05, 2022 10:26 AM. If you have any questions, ask your nurse or doctor.          STOP taking these medications    amoxicillin 500 MG capsule Commonly known as: AMOXIL Stopped by: Scarlette Shorts, MD   cephALEXin 500  MG capsule Commonly known as: KEFLEX Stopped by: Scarlette Shorts, MD   fenofibrate 145 MG tablet Commonly known as: TRICOR Stopped by: Scarlette Shorts, MD   oxyCODONE-acetaminophen 5-325 MG tablet Commonly known as: PERCOCET/ROXICET Stopped by: Scarlette Shorts, MD       TAKE these medications    ibuprofen 800 MG tablet Commonly known as: ADVIL   levothyroxine 88 MCG tablet Commonly known as: SYNTHROID Take 1 tablet (88 mcg total) by mouth daily.   meclizine 25 MG tablet Commonly known as: ANTIVERT Take 1 tablet (25 mg total) by mouth 3 (three) times daily as needed.   niacin 500 MG ER tablet Commonly known as: VITAMIN B3 Take 500 mg by mouth every evening.   simvastatin 40 MG tablet Commonly known as: ZOCOR Take 40 mg by mouth every evening.          REVIEW OF SYSTEMS: A comprehensive ROS was conducted with the patient and is negative except as per HPI     OBJECTIVE:  VS: BP 124/72 (BP Location: Left Arm, Patient Position: Sitting, Cuff Size: Small)   Pulse 74   Ht 4\' 11"  (1.499 m)   Wt 132 lb 12.8 oz (60.2 kg)   SpO2 98%   BMI 26.82 kg/m    Wt Readings from Last 3 Encounters:  08/05/22 132 lb 12.8  oz (60.2 kg)  01/31/22 136 lb (61.7 kg)  09/26/13 140 lb (63.5 kg)     EXAM: General: Pt appears well and is in NAD  Neck: General: Supple without adenopathy. Thyroid:No goiter or nodules appreciated.   Lungs: Clear with good BS bilat   Heart: Auscultation: RRR.  Abdomen: soft, nontender, without masses or organomegaly palpable  Extremities:  BL LE: No pretibial edema   Mental Status: Judgment, insight: Intact Orientation: Oriented to time, place, and person Mood and affect: No depression, anxiety, or agitation     DATA REVIEWED:   Latest Reference Range & Units 08/05/22 10:39  TSH 0.35 - 5.50 uIU/mL 0.03 (L)     ASSESSMENT/PLAN/RECOMMENDATIONS:   Postablative hypothyroidism:  -Pt is clinically euthyroid  - Pt educated  extensively on the correct way to take levothyroxine (first thing in the morning with water, 30 minutes before eating or taking other medications). - Pt encouraged to double dose the following day if she were to miss a dose given long half-life of levothyroxine. -TSH remains to be low, will reduce levothyroxine as below -Will repeat labs in 3 months    Medications :  Stop levothyroxine 88 mcg daily Start levothyroxine 75 mcg daily  Follow-up in 6 months  Signed electronically by: Lyndle Herrlich, MD  [facility] Endocrinology  [facility] [address] [place] [place], [place] [address] Phone: [phone] FAX: [phone]   CC: Norm Salt, [place] [address] [place] [address] Phone: [phone] Fax: [phone]   Return to Endocrinology clinic as below: No future appointments.

## 2022-08-05 NOTE — Telephone Encounter (Signed)
Please let the patient know that her thyroid test continues to show that the current dose of levothyroxine is too much for her..   Please stop levothyroxine 88 mcg daily and START levothyroxine 75 mcg daily   Please schedule the patient for repeat thyroid test in 3 months    Thanks

## 2022-10-16 DIAGNOSIS — E559 Vitamin D deficiency, unspecified: Secondary | ICD-10-CM | POA: Diagnosis not present

## 2022-10-16 DIAGNOSIS — E119 Type 2 diabetes mellitus without complications: Secondary | ICD-10-CM | POA: Diagnosis not present

## 2022-10-16 DIAGNOSIS — E785 Hyperlipidemia, unspecified: Secondary | ICD-10-CM | POA: Diagnosis not present

## 2022-10-16 DIAGNOSIS — E039 Hypothyroidism, unspecified: Secondary | ICD-10-CM | POA: Diagnosis not present

## 2022-10-27 DIAGNOSIS — E039 Hypothyroidism, unspecified: Secondary | ICD-10-CM | POA: Diagnosis not present

## 2022-10-27 DIAGNOSIS — E559 Vitamin D deficiency, unspecified: Secondary | ICD-10-CM | POA: Diagnosis not present

## 2022-10-27 DIAGNOSIS — E785 Hyperlipidemia, unspecified: Secondary | ICD-10-CM | POA: Diagnosis not present

## 2022-10-27 DIAGNOSIS — E119 Type 2 diabetes mellitus without complications: Secondary | ICD-10-CM | POA: Diagnosis not present

## 2022-11-04 ENCOUNTER — Other Ambulatory Visit (INDEPENDENT_AMBULATORY_CARE_PROVIDER_SITE_OTHER): Payer: Medicare Other

## 2022-11-04 DIAGNOSIS — E890 Postprocedural hypothyroidism: Secondary | ICD-10-CM | POA: Diagnosis not present

## 2022-11-04 LAB — T4, FREE: Free T4: 0.88 ng/dL (ref 0.60–1.60)

## 2022-11-04 LAB — TSH: TSH: 0.4 u[IU]/mL (ref 0.35–5.50)

## 2022-11-05 ENCOUNTER — Encounter: Payer: Self-pay | Admitting: Internal Medicine

## 2022-11-24 DIAGNOSIS — Z0001 Encounter for general adult medical examination with abnormal findings: Secondary | ICD-10-CM | POA: Diagnosis not present

## 2022-11-24 DIAGNOSIS — E119 Type 2 diabetes mellitus without complications: Secondary | ICD-10-CM | POA: Diagnosis not present

## 2022-11-24 DIAGNOSIS — E039 Hypothyroidism, unspecified: Secondary | ICD-10-CM | POA: Diagnosis not present

## 2022-11-24 DIAGNOSIS — E559 Vitamin D deficiency, unspecified: Secondary | ICD-10-CM | POA: Diagnosis not present

## 2022-11-24 DIAGNOSIS — E785 Hyperlipidemia, unspecified: Secondary | ICD-10-CM | POA: Diagnosis not present

## 2022-12-05 ENCOUNTER — Emergency Department (HOSPITAL_BASED_OUTPATIENT_CLINIC_OR_DEPARTMENT_OTHER): Payer: Medicare Other | Admitting: Radiology

## 2022-12-05 ENCOUNTER — Other Ambulatory Visit: Payer: Self-pay

## 2022-12-05 ENCOUNTER — Emergency Department (HOSPITAL_BASED_OUTPATIENT_CLINIC_OR_DEPARTMENT_OTHER)
Admission: EM | Admit: 2022-12-05 | Discharge: 2022-12-05 | Disposition: A | Discharge status: Home / Self Care | Payer: Medicare Other

## 2022-12-05 ENCOUNTER — Encounter (HOSPITAL_BASED_OUTPATIENT_CLINIC_OR_DEPARTMENT_OTHER): Payer: Self-pay | Admitting: Emergency Medicine

## 2022-12-05 DIAGNOSIS — M25712 Osteophyte, left shoulder: Secondary | ICD-10-CM | POA: Diagnosis not present

## 2022-12-05 DIAGNOSIS — Y92410 Unspecified street and highway as the place of occurrence of the external cause: Secondary | ICD-10-CM | POA: Diagnosis not present

## 2022-12-05 DIAGNOSIS — S199XXA Unspecified injury of neck, initial encounter: Secondary | ICD-10-CM | POA: Diagnosis not present

## 2022-12-05 DIAGNOSIS — M47812 Spondylosis without myelopathy or radiculopathy, cervical region: Secondary | ICD-10-CM | POA: Diagnosis not present

## 2022-12-05 DIAGNOSIS — M2578 Osteophyte, vertebrae: Secondary | ICD-10-CM | POA: Diagnosis not present

## 2022-12-05 DIAGNOSIS — M542 Cervicalgia: Secondary | ICD-10-CM | POA: Diagnosis not present

## 2022-12-05 DIAGNOSIS — M4310 Spondylolisthesis, site unspecified: Secondary | ICD-10-CM | POA: Diagnosis not present

## 2022-12-05 DIAGNOSIS — M79602 Pain in left arm: Secondary | ICD-10-CM | POA: Insufficient documentation

## 2022-12-05 DIAGNOSIS — S299XXA Unspecified injury of thorax, initial encounter: Secondary | ICD-10-CM | POA: Diagnosis not present

## 2022-12-05 DIAGNOSIS — S4992XA Unspecified injury of left shoulder and upper arm, initial encounter: Secondary | ICD-10-CM | POA: Diagnosis not present

## 2022-12-05 MED ORDER — CYCLOBENZAPRINE HCL 10 MG PO TABS: 5.0000 mg | ORAL_TABLET | Freq: Two times a day (BID) | ORAL | 0 refills | Status: AC | PRN

## 2022-12-05 NOTE — ED Notes (Signed)
Reviewed AVS with patient, patient expressed understanding of directions, denies further questions at this time. 
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
## 2022-12-11 ENCOUNTER — Telehealth: Payer: Self-pay

## 2022-12-11 NOTE — Telephone Encounter (Signed)
Transition Care Management Follow-up Telephone Call Date of discharge and from where: [facility] 8/9 How have you been since you were released from the hospital? Doing ok but still sore and going to therapy Any questions or concerns? No  Items Reviewed: Did the pt receive and understand the discharge instructions provided? Yes  Medications obtained and verified? No  Other? No  Any new allergies since your discharge? No  Dietary orders reviewed? No Do you have support at home? Yes    Follow up appointments reviewed:  PCP Hospital f/u appt confirmed? Yes  Scheduled to see PCP on 8/16 @ 11:30. Specialist Hospital f/u appt confirmed? Yes  Scheduled to see  on  @ . Are transportation arrangements needed? Yes  If their condition worsens, is the pt aware to call PCP or go to the Emergency Dept.? Yes Was the patient provided with contact information for the PCP's office or ED? Yes Was to pt encouraged to call back with questions or concerns? Yes

## 2022-12-12 DIAGNOSIS — E119 Type 2 diabetes mellitus without complications: Secondary | ICD-10-CM | POA: Diagnosis not present

## 2022-12-12 DIAGNOSIS — E785 Hyperlipidemia, unspecified: Secondary | ICD-10-CM | POA: Diagnosis not present

## 2022-12-12 DIAGNOSIS — E559 Vitamin D deficiency, unspecified: Secondary | ICD-10-CM | POA: Diagnosis not present

## 2022-12-12 DIAGNOSIS — E039 Hypothyroidism, unspecified: Secondary | ICD-10-CM | POA: Diagnosis not present

## 2023-02-11 ENCOUNTER — Ambulatory Visit: Payer: Medicare Other | Admitting: Internal Medicine

## 2023-02-11 NOTE — Progress Notes (Deleted)
Name: Vicki Walls  MRN/ DOB: [id number], [birth year]    Age/ Sex: 67 y.o., female    PCP: Norm Salt, PA   Reason for Endocrinology Evaluation: Hypothyroidism     Date of Initial Endocrinology Evaluation: 01/31/2022    HPI: Vicki Walls is a 67 y.o. female with a past medical history of postablative hypothyroid. The patient presented for initial endocrinology clinic visit on 01/31/2022  for consultative assistance with her Hypothyroidism.   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  She is s/p RAI on 10/18/2004   30 mCi of I-131.    She had a TSH of 82 u IU/mL in January 2023, but the patient admits that she had ran out of levothyroxine at the time     No biotin  Niece  with thyroid disease   SUBJECTIVE:    Today (02/11/23): Vicki Walls is here for a follow up on hypothyroidism  Weight stable  Denies local neck swelling  Denies loose stools or diarrhea  Denies palpitations  Denies tremors    Levothyroxine 75 mcg daily   HISTORY:  Past Medical History:  Past Medical History:  Diagnosis Date   Hypercholesterolemia    Thyroid disease    Past Surgical History: No past surgical history on file.  Social History:  reports that she has never smoked. She does not have any smokeless tobacco history on file. She reports that she does not drink alcohol and does not use drugs. Family History: family history includes Hypertension in her brother and mother.   HOME MEDICATIONS: Allergies as of 02/11/2023   No Known Allergies      Medication List        Accurate as of February 11, 2023  7:13 AM. If you have any questions, ask your nurse or doctor.          cyclobenzaprine 10 MG tablet Commonly known as: FLEXERIL Take 0.5 tablets (5 mg total) by mouth 2 (two) times daily as needed for muscle  spasms.   ibuprofen 800 MG tablet Commonly known as: ADVIL   levothyroxine 75 MCG tablet Commonly known as: SYNTHROID Take 1 tablet (75 mcg total) by mouth daily.   meclizine 25 MG tablet Commonly known as: ANTIVERT Take 1 tablet (25 mg total) by mouth 3 (three) times daily as needed.   niacin 500 MG ER tablet Commonly known as: VITAMIN B3 Take 500 mg by mouth every evening.   simvastatin 40 MG tablet Commonly known as: ZOCOR Take 40 mg by mouth every evening.          REVIEW OF SYSTEMS: A comprehensive ROS was conducted with the patient and is negative except as per HPI     OBJECTIVE:  VS: There were no vitals taken for this visit.   Wt Readings from Last 3 Encounters:  12/05/22 132 lb (59.9 kg)  08/05/22 132 lb 12.8 oz (60.2 kg)  01/31/22 136 lb (61.7 kg)     EXAM: General: Pt appears well and is in NAD  Neck: General: Supple without adenopathy. Thyroid:No goiter or nodules appreciated.   Lungs: Clear with good BS bilat   Heart: Auscultation: RRR.  Abdomen: soft, nontender  Extremities:  BL LE: No pretibial edema   Mental Status: Judgment, insight: Intact Orientation: Oriented to time, place, and person Mood and affect: No depression, anxiety, or agitation  DATA REVIEWED:   Latest Reference Range & Units 08/05/22 10:39  TSH 0.35 - 5.50 uIU/mL 0.03 (L)     ASSESSMENT/PLAN/RECOMMENDATIONS:   Postablative hypothyroidism:  -Pt is clinically euthyroid  - Pt educated extensively on the correct way to take levothyroxine (first thing in the morning with water, 30 minutes before eating or taking other medications). - Pt encouraged to double dose the following day if she were to miss a dose given long half-life of levothyroxine. -TSH remains to be low, will reduce levothyroxine as below -Will repeat labs in 3 months    Medications :   levothyroxine 75 mcg daily  Follow-up in 6 months  Signed electronically by: Lyndle Herrlich,  MD  [facility] Endocrinology  [facility] [address] [place] [place], [place] [address] Phone: [phone] FAX: [phone]   CC: Norm Salt, [place] [address] [place] [address] Phone: [phone] Fax: [phone]   Return to Endocrinology clinic as below: Future Appointments  Date Time Provider Department Center  02/11/2023 10:10 AM Miasha Emmons, Konrad Dolores, MD [id number] None

## 2023-03-04 DIAGNOSIS — E559 Vitamin D deficiency, unspecified: Secondary | ICD-10-CM | POA: Diagnosis not present

## 2023-03-04 DIAGNOSIS — E039 Hypothyroidism, unspecified: Secondary | ICD-10-CM | POA: Diagnosis not present

## 2023-03-04 DIAGNOSIS — Z23 Encounter for immunization: Secondary | ICD-10-CM | POA: Diagnosis not present

## 2023-03-04 DIAGNOSIS — E119 Type 2 diabetes mellitus without complications: Secondary | ICD-10-CM | POA: Diagnosis not present

## 2023-03-04 DIAGNOSIS — E785 Hyperlipidemia, unspecified: Secondary | ICD-10-CM | POA: Diagnosis not present

## 2023-05-18 DIAGNOSIS — Z1231 Encounter for screening mammogram for malignant neoplasm of breast: Secondary | ICD-10-CM | POA: Diagnosis not present

## 2023-06-04 DIAGNOSIS — E119 Type 2 diabetes mellitus without complications: Secondary | ICD-10-CM | POA: Diagnosis not present

## 2023-06-04 DIAGNOSIS — E039 Hypothyroidism, unspecified: Secondary | ICD-10-CM | POA: Diagnosis not present

## 2023-06-04 DIAGNOSIS — E559 Vitamin D deficiency, unspecified: Secondary | ICD-10-CM | POA: Diagnosis not present

## 2023-06-04 DIAGNOSIS — E785 Hyperlipidemia, unspecified: Secondary | ICD-10-CM | POA: Diagnosis not present

## 2023-10-06 DIAGNOSIS — H5203 Hypermetropia, bilateral: Secondary | ICD-10-CM | POA: Diagnosis not present

## 2023-10-06 DIAGNOSIS — H524 Presbyopia: Secondary | ICD-10-CM | POA: Diagnosis not present

## 2023-10-13 DIAGNOSIS — E119 Type 2 diabetes mellitus without complications: Secondary | ICD-10-CM | POA: Diagnosis not present

## 2023-10-13 DIAGNOSIS — Z0000 Encounter for general adult medical examination without abnormal findings: Secondary | ICD-10-CM | POA: Diagnosis not present

## 2023-10-13 DIAGNOSIS — E039 Hypothyroidism, unspecified: Secondary | ICD-10-CM | POA: Diagnosis not present

## 2023-10-13 DIAGNOSIS — E559 Vitamin D deficiency, unspecified: Secondary | ICD-10-CM | POA: Diagnosis not present

## 2023-10-13 DIAGNOSIS — E785 Hyperlipidemia, unspecified: Secondary | ICD-10-CM | POA: Diagnosis not present

## 2023-11-24 DIAGNOSIS — E785 Hyperlipidemia, unspecified: Secondary | ICD-10-CM | POA: Diagnosis not present

## 2023-11-24 DIAGNOSIS — E119 Type 2 diabetes mellitus without complications: Secondary | ICD-10-CM | POA: Diagnosis not present

## 2023-11-24 DIAGNOSIS — E559 Vitamin D deficiency, unspecified: Secondary | ICD-10-CM | POA: Diagnosis not present

## 2023-11-24 DIAGNOSIS — E039 Hypothyroidism, unspecified: Secondary | ICD-10-CM | POA: Diagnosis not present

## 2023-11-24 DIAGNOSIS — Z0001 Encounter for general adult medical examination with abnormal findings: Secondary | ICD-10-CM | POA: Diagnosis not present

## 2024-02-17 DIAGNOSIS — E785 Hyperlipidemia, unspecified: Secondary | ICD-10-CM | POA: Diagnosis not present

## 2024-02-17 DIAGNOSIS — E119 Type 2 diabetes mellitus without complications: Secondary | ICD-10-CM | POA: Diagnosis not present

## 2024-02-17 DIAGNOSIS — E039 Hypothyroidism, unspecified: Secondary | ICD-10-CM | POA: Diagnosis not present

## 2024-02-17 DIAGNOSIS — E559 Vitamin D deficiency, unspecified: Secondary | ICD-10-CM | POA: Diagnosis not present

## 2024-02-17 DIAGNOSIS — Z23 Encounter for immunization: Secondary | ICD-10-CM | POA: Diagnosis not present

## 2024-03-30 DIAGNOSIS — E785 Hyperlipidemia, unspecified: Secondary | ICD-10-CM | POA: Diagnosis not present

## 2024-03-30 DIAGNOSIS — E559 Vitamin D deficiency, unspecified: Secondary | ICD-10-CM | POA: Diagnosis not present

## 2024-03-30 DIAGNOSIS — E039 Hypothyroidism, unspecified: Secondary | ICD-10-CM | POA: Diagnosis not present

## 2024-03-30 DIAGNOSIS — E119 Type 2 diabetes mellitus without complications: Secondary | ICD-10-CM | POA: Diagnosis not present
